# Patient Record
Sex: Female | Born: 1995 | Race: White | Hispanic: No | Marital: Single | Smoking: Never smoker
Health system: Southern US, Community
[De-identification: ages and names within clinical notes are randomized; demographics above are authoritative.]

---
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
## 2017-04-22 ENCOUNTER — Encounter: Payer: Self-pay | Admitting: Emergency Medicine

## 2017-04-22 ENCOUNTER — Emergency Department: Payer: BLUE CROSS/BLUE SHIELD

## 2017-04-22 ENCOUNTER — Emergency Department
Admission: EM | Admit: 2017-04-22 | Discharge: 2017-04-22 | Disposition: A | Discharge status: Home / Self Care | Payer: BLUE CROSS/BLUE SHIELD | Attending: Emergency Medicine | Admitting: Emergency Medicine

## 2017-04-22 DIAGNOSIS — Z87442 Personal history of urinary calculi: Secondary | ICD-10-CM | POA: Insufficient documentation

## 2017-04-22 DIAGNOSIS — R109 Unspecified abdominal pain: Secondary | ICD-10-CM | POA: Diagnosis present

## 2017-04-22 DIAGNOSIS — M549 Dorsalgia, unspecified: Secondary | ICD-10-CM | POA: Diagnosis not present

## 2017-04-22 LAB — URINALYSIS, ROUTINE W REFLEX MICROSCOPIC
BILIRUBIN URINE: NEGATIVE
GLUCOSE, UA: NEGATIVE mg/dL
Hgb urine dipstick: NEGATIVE
Ketones, ur: NEGATIVE mg/dL
Leukocytes, UA: NEGATIVE
Nitrite: NEGATIVE
PH: 6 (ref 5.0–8.0)
Protein, ur: NEGATIVE mg/dL
SPECIFIC GRAVITY, URINE: 1.012 (ref 1.005–1.030)

## 2017-04-22 LAB — POCT PREGNANCY, URINE: Preg Test, Ur: NEGATIVE

## 2017-04-22 LAB — COMPREHENSIVE METABOLIC PANEL
ALT: 14 U/L (ref 14–54)
ANION GAP: 7 (ref 5–15)
AST: 18 U/L (ref 15–41)
Albumin: 4.1 g/dL (ref 3.5–5.0)
Alkaline Phosphatase: 49 U/L (ref 38–126)
BUN: 10 mg/dL (ref 6–20)
CO2: 24 mmol/L (ref 22–32)
Calcium: 9.3 mg/dL (ref 8.9–10.3)
Chloride: 108 mmol/L (ref 101–111)
Creatinine, Ser: 0.71 mg/dL (ref 0.44–1.00)
GFR calc Af Amer: >60 mL/min (ref >60)
Glucose, Bld: 97 mg/dL (ref 65–99)
POTASSIUM: 3.7 mmol/L (ref 3.5–5.1)
Sodium: 139 mmol/L (ref 135–145)
TOTAL PROTEIN: 7.7 g/dL (ref 6.5–8.1)
Total Bilirubin: 0.6 mg/dL (ref 0.3–1.2)

## 2017-04-22 LAB — CBC
HEMATOCRIT: 38.8 % (ref 35.0–47.0)
Hemoglobin: 13.2 g/dL (ref 12.0–16.0)
MCH: 30.4 pg (ref 26.0–34.0)
MCHC: 34.1 g/dL (ref 32.0–36.0)
MCV: 89.3 fL (ref 80.0–100.0)
Platelets: 307 10*3/uL (ref 150–440)
RBC: 4.35 MIL/uL (ref 3.80–5.20)
RDW: 13.1 % (ref 11.5–14.5)
WBC: 5.4 10*3/uL (ref 3.6–11.0)

## 2017-04-22 LAB — LIPASE, BLOOD: LIPASE: 18 U/L (ref 11–51)

## 2017-04-22 MED ORDER — KETOROLAC TROMETHAMINE 30 MG/ML IJ SOLN
15.0000 mg | Freq: Once | INTRAMUSCULAR | Status: AC
  Administered 2017-04-22: 15 mg via INTRAVENOUS
  Filled 2017-04-22: qty 1

## 2017-04-22 MED ORDER — SODIUM CHLORIDE 0.9 % IV BOLUS (SEPSIS)
1000.0000 mL | Freq: Once | INTRAVENOUS | Status: AC
  Administered 2017-04-22: 1000 mL via INTRAVENOUS

## 2017-04-22 NOTE — Discharge Instructions (Signed)
Has been discussed please return to the emergency department for any worsening pain, abdominal pain, fever, or any other symptom personally concerning to yourself.

## 2017-04-22 NOTE — ED Notes (Signed)
Pt c/o all over back pain since this morning with nausea.. Denies vomiting.. States she has a hx of kidney stones and this feels similar. Pt is calm laying on the stretcher texting during the exam.. Denies any blood in urine or vaginal discharge..Marland Kitchen

## 2017-04-22 NOTE — ED Notes (Signed)
Pt back from ultrasound.

## 2017-04-22 NOTE — ED Triage Notes (Signed)
Patient presents to ED via POV from home with c/o bilateral flank pain. Hx of kidney stones that required surgery. Patient denies dysuria or frank blood in urine. Patient also reports nausea. Patient texting during triage.

## 2017-04-22 NOTE — ED Notes (Signed)
Patient transported to Ultrasound 

## 2017-04-22 NOTE — ED Provider Notes (Signed)
[facility] Emergency Department Provider Note  Time seen: 9:45 AM  I have reviewed the triage vital signs and the nursing notes.   HISTORY  Chief Complaint Flank Pain    HPI Tonya Rubio is a 21 y.o. female with a past medical history of a kidney stone presents to the emergency department for back and abdominal discomfort. According to the patient approximately one year ago she had a kidney stone requiring interventional removal. Patient states she was at her doctor this morning for a routine visit. States she has been having some back pain in her mid back on both sides for the past 2 days all at her doctor she developed fairly sharp pain mostly in the right back and right abdomen. Patient states she felt nauseated so they brought her to the emergency department for evaluation. She states she feels like this is very similar to when she had a kidney stone one year ago but denies any dysuria or hematuria. Denies any fever. States her pain is currently a 3/10 dull pain in the bilateral mid back.  History reviewed. No pertinent past medical history.  There are no active problems to display for this patient.   History reviewed. No pertinent surgical history.  Prior to Admission medications   Not on File    No Known Allergies  No family history on file.  Social History Social History  Substance Use Topics  . Smoking status: Never Smoker  . Smokeless tobacco: Never Used  . Alcohol use Yes     Comment: occ    Review of Systems Constitutional: Negative for fever. Cardiovascular: Negative for chest pain. Respiratory: Negative for shortness of breath. Gastrointestinal: Mild right sided abdominal pain. Positive for nausea. Negative for vomiting or diarrhea Genitourinary: Negative for dysuria. Negative for hematuria Musculoskeletal: Positive for bilateral mid back pain Neurological: Negative for headache All other ROS  negative  ____________________________________________   PHYSICAL EXAM:  VITAL SIGNS: ED Triage Vitals  Enc Vitals Group     BP 04/22/17 0927 109/74     Pulse Rate 04/22/17 0927 (!) 58     Resp 04/22/17 0927 20     Temp 04/22/17 0927 97.6 F (36.4 C)     Temp Source 04/22/17 0927 Oral     SpO2 04/22/17 0927 100 %     Weight 04/22/17 0927 135 lb (61.2 kg)     Height 04/22/17 0927 5' 6.25" (1.683 m)     Head Circumference --      Peak Flow --      Pain Score 04/22/17 0929 3     Pain Loc --      Pain Edu? --      Excl. in GC? --     Constitutional: Alert and oriented. Well appearing and in no distress. Eyes: Normal exam ENT   Head: Normocephalic and atraumatic   Mouth/Throat: Mucous membranes are moist. Cardiovascular: Normal rate, regular rhythm.  Respiratory: Normal respiratory effort without tachypnea nor retractions. Breath sounds are clear  Gastrointestinal: Soft, mild right mid abdominal tenderness to palpation without rebound or guarding, no CVA tenderness. Musculoskeletal: Nontender with normal range of motion in all extremities. Neurologic:  Normal speech and language. No gross focal neurologic deficits  Skin:  Skin is warm, dry and intact.  Psychiatric: Mood and affect are normal.   ____________________________________________   RADIOLOGY  Renal ultrasound normal Right upper quadrant ultrasound normal  ____________________________________________   INITIAL IMPRESSION / ASSESSMENT AND PLAN / ED COURSE  Pertinent  labs & imaging results that were available during my care of the patient were reviewed by me and considered in my medical decision making (see chart for details).  Patient presents emergency department for bilateral back pain and right abdominal pain. Patient states the symptoms seem similar to her past kidney stone. We will check labs, obtain an ultrasound of the kidneys as well as right upper quadrant. We will check labs and closely  monitor.  Patient's ultrasounds are normal. Labs are normal. Patient states she is feeling much better. At this time I believe the patient is safe to be discharged home and will follow up with her doctor. I discussed return precautions.  ____________________________________________   FINAL CLINICAL IMPRESSION(S) / ED DIAGNOSES  Back pain Abdominal pain    Minna Antis, MD 04/22/17 1146

## 2017-04-23 LAB — URINE CULTURE: CULTURE: NO GROWTH

## 2017-04-24 IMAGING — CR DG ANKLE COMPLETE 3+V*L*
1 series · 3 of 3 positions shown · non-contrast
Comparison: None.

CLINICAL DATA: Patient reports injuring her L ankle at Lacrosse
practice on [REDACTED]. Was seen at school clinic and given crutches and
ace wrap and was told to stay off of ankle for three days. Patient
reports she still cannot put pressure on left foot without extreme
pain.

EXAM:
LEFT ANKLE COMPLETE - 3+ VIEW

[Series 1: ap · 0.17mm/px · 3 of 3 slices shown]
[im 1/3]
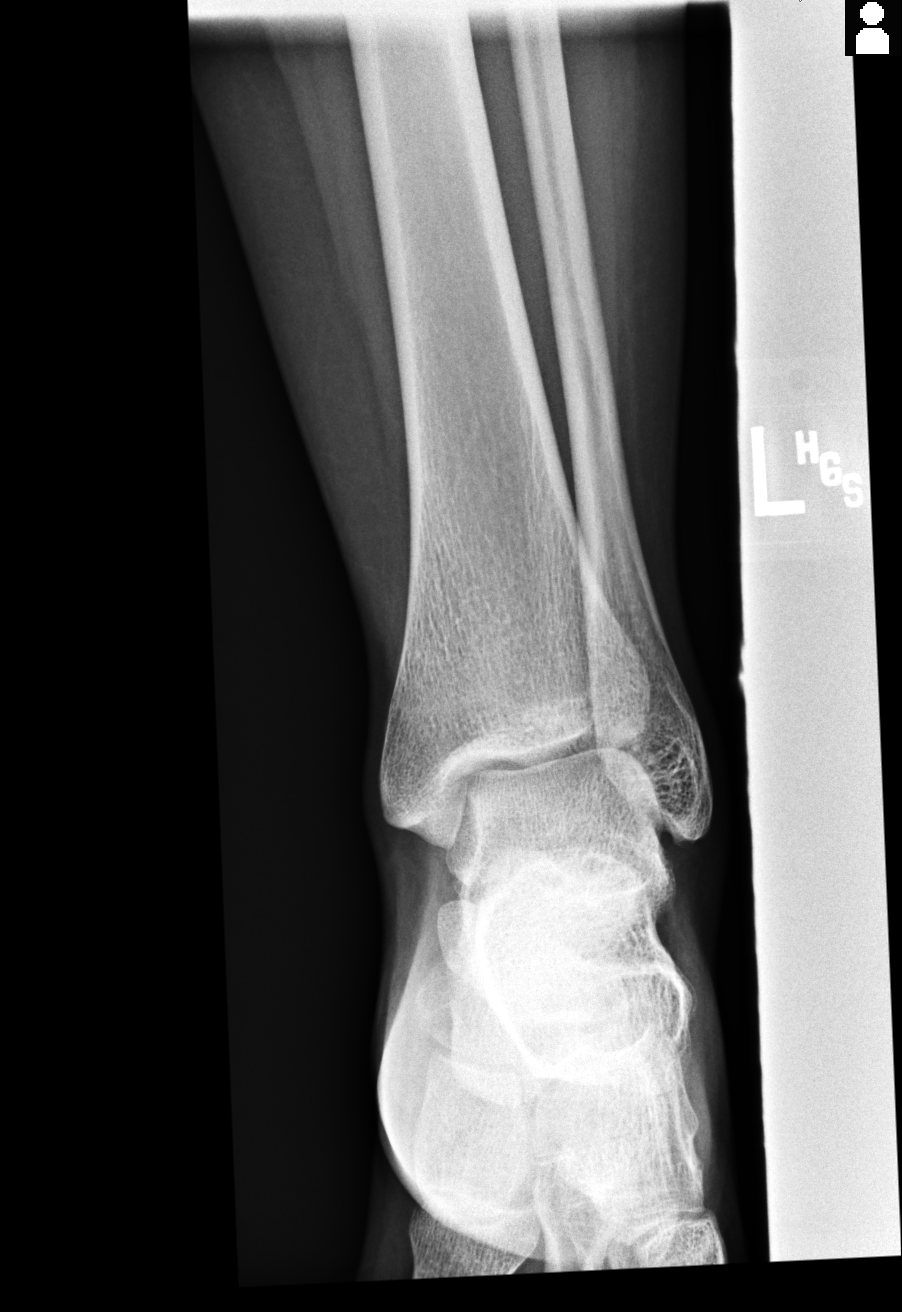
[im 2/3]
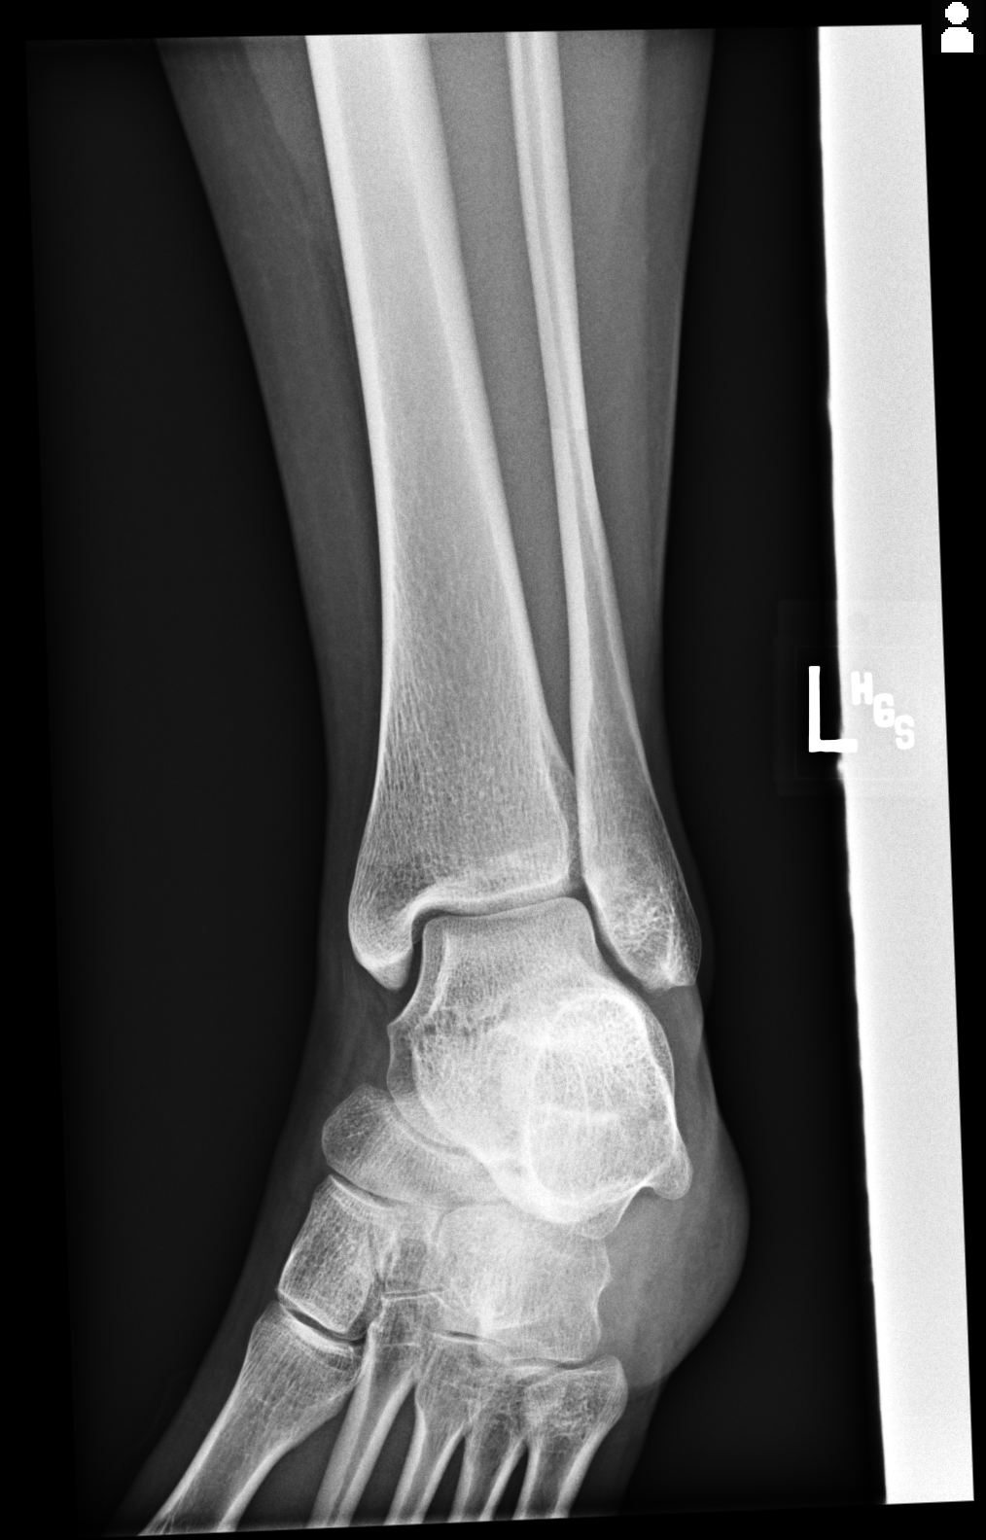
[im 3/3]
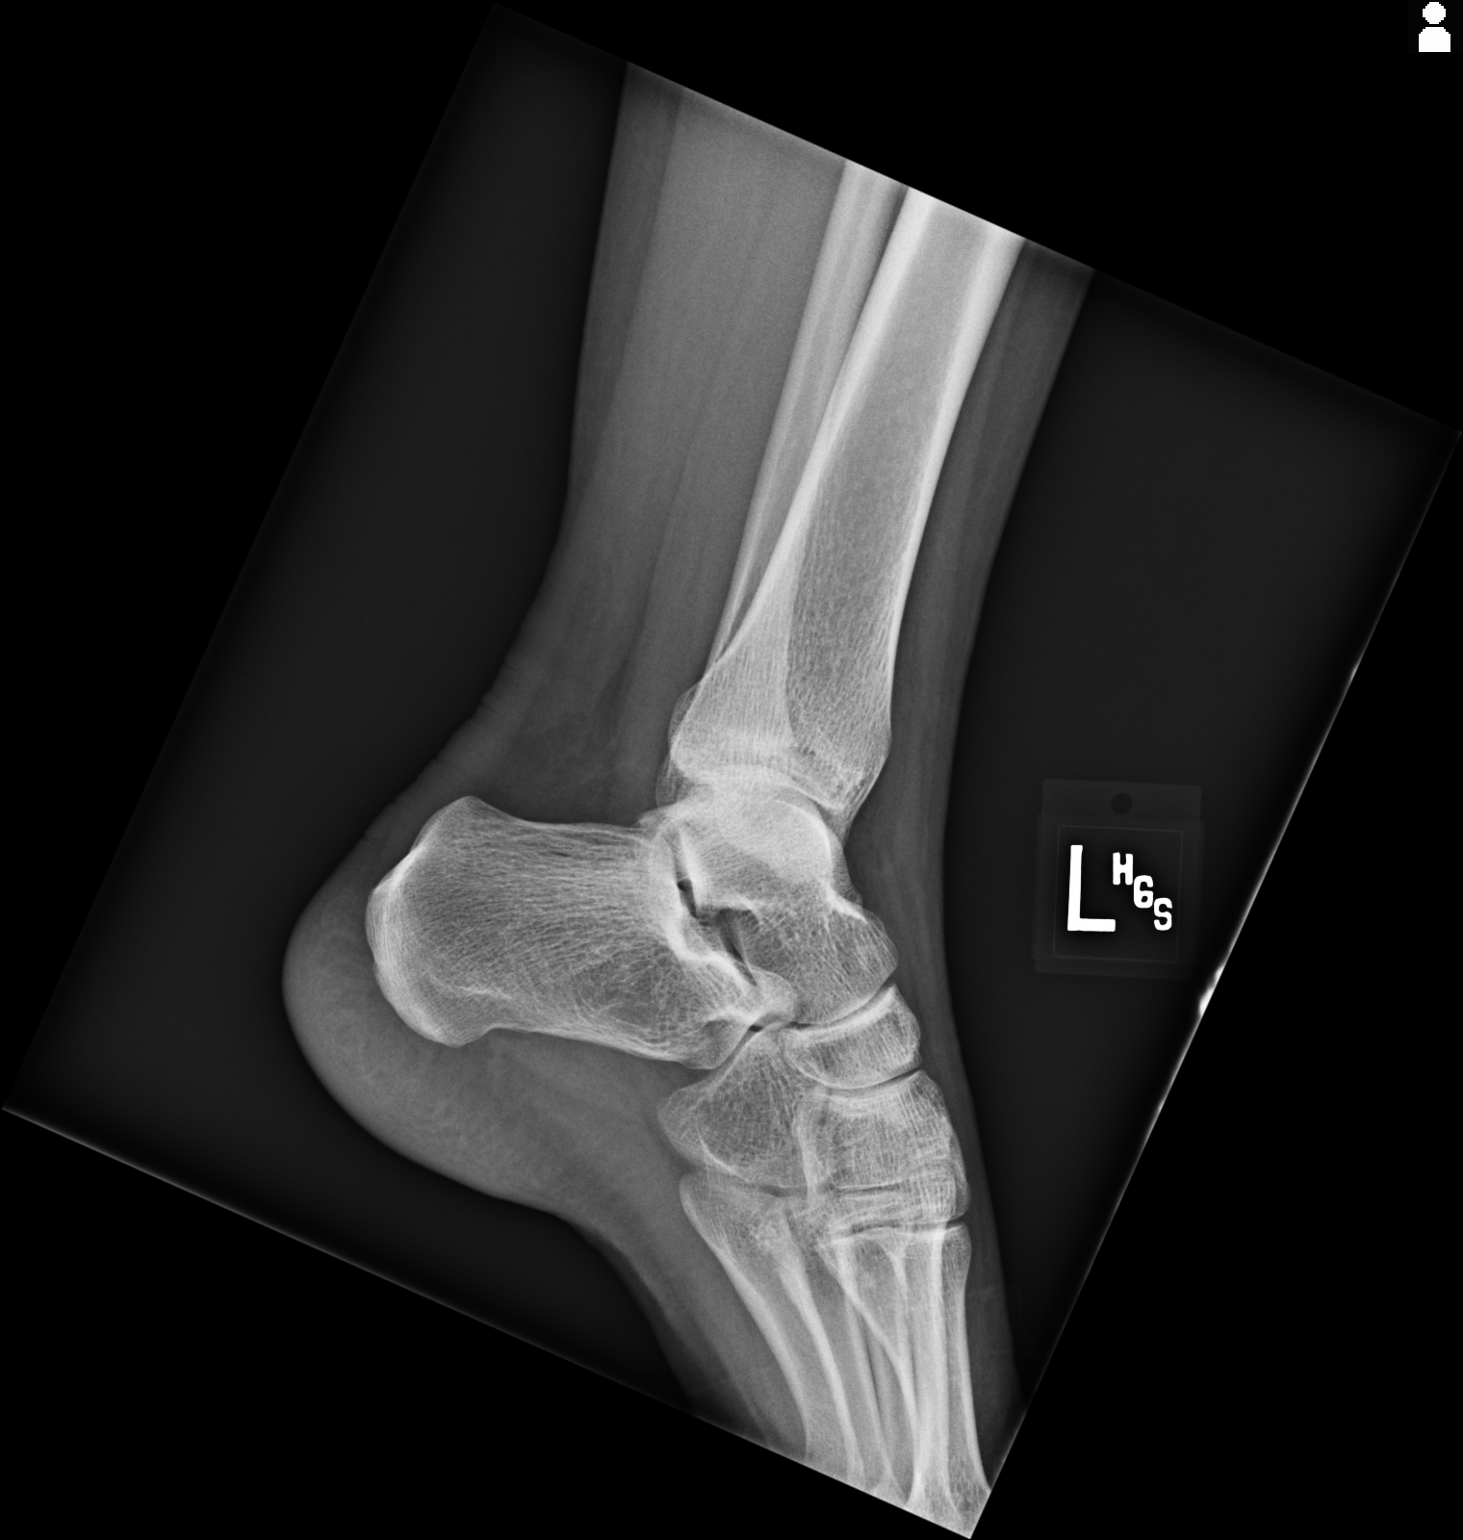

[3 of 3 positions shown; findings below may reference images not displayed]

FINDINGS: There is no evidence of fracture, dislocation, or joint effusion.
There is no evidence of arthropathy or other focal bone abnormality.
Soft tissues are unremarkable.
IMPRESSION: Negative.

## 2018-03-20 IMAGING — US US RENAL
1 series · 14 of 25 positions shown · non-contrast
Comparison: No recent prior .

CLINICAL DATA: Right upper quadrant and back pain.

EXAM:
RENAL / URINARY TRACT ULTRASOUND COMPLETE

[Series 1: us renal · 0.19mm/px · 14 of 34 slices shown]
[im 1/34]
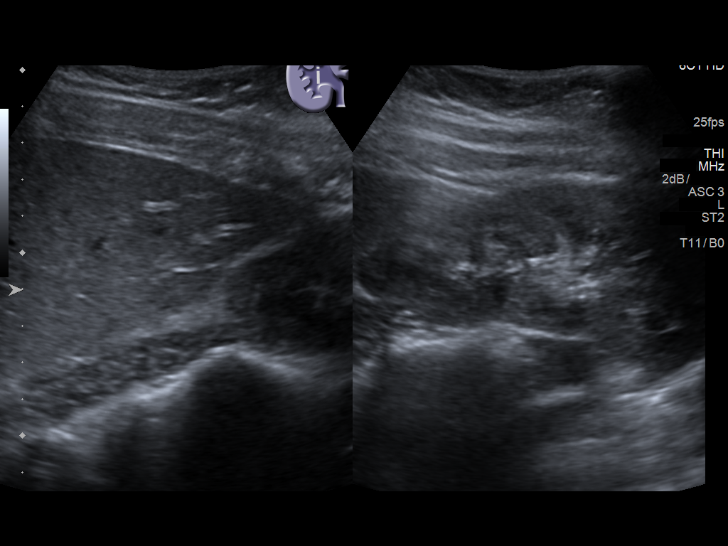
[im 3/34]
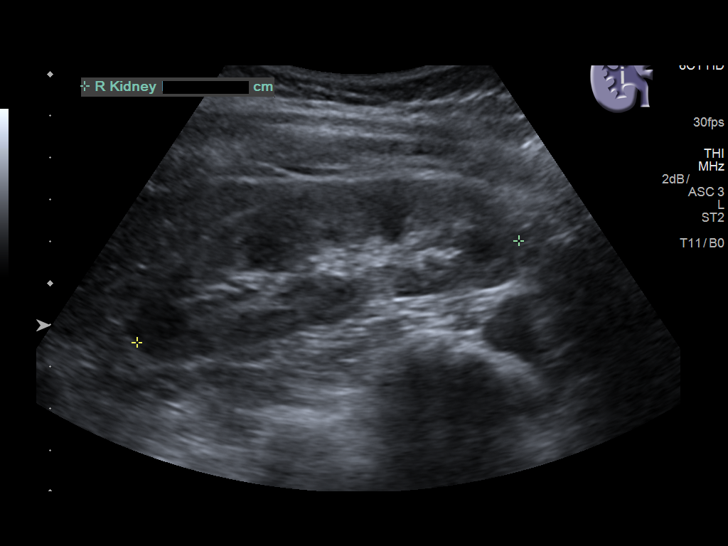
[im 6/34]
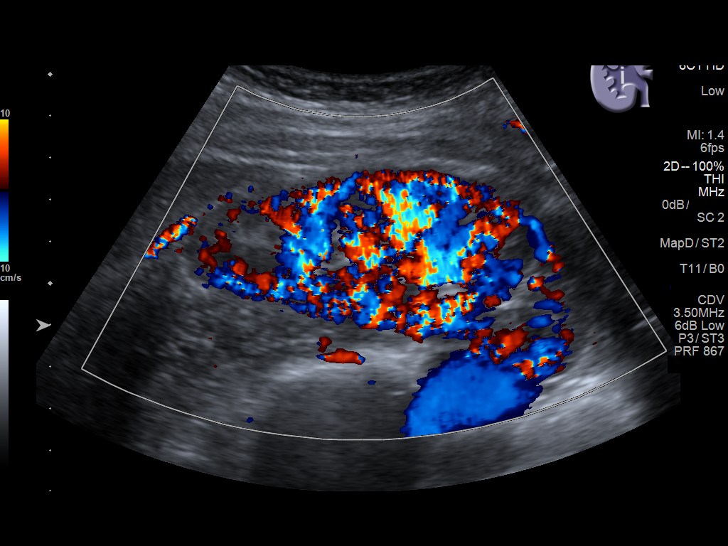
[im 9/34]
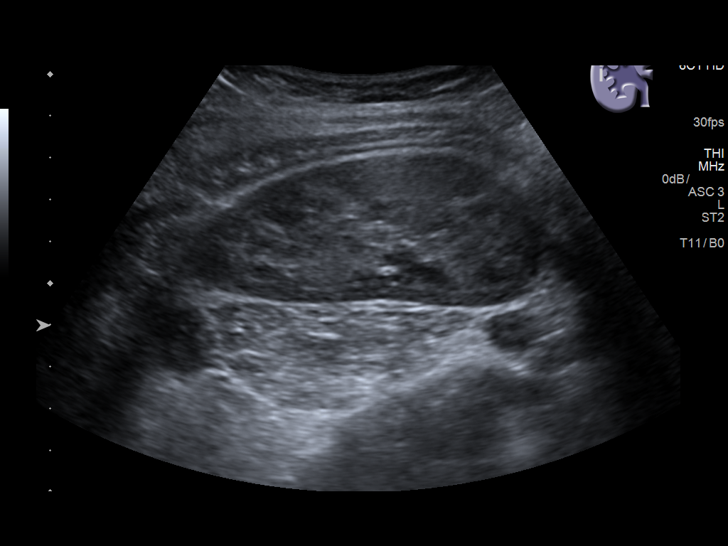
[im 12/34]
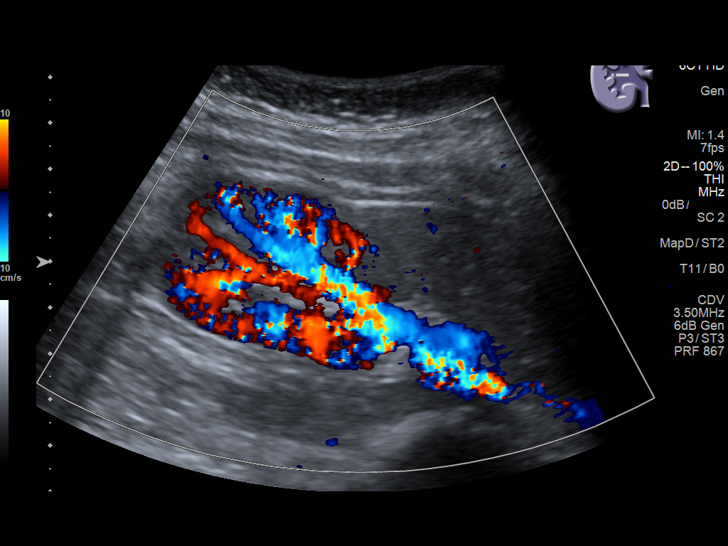
[im 13/34]
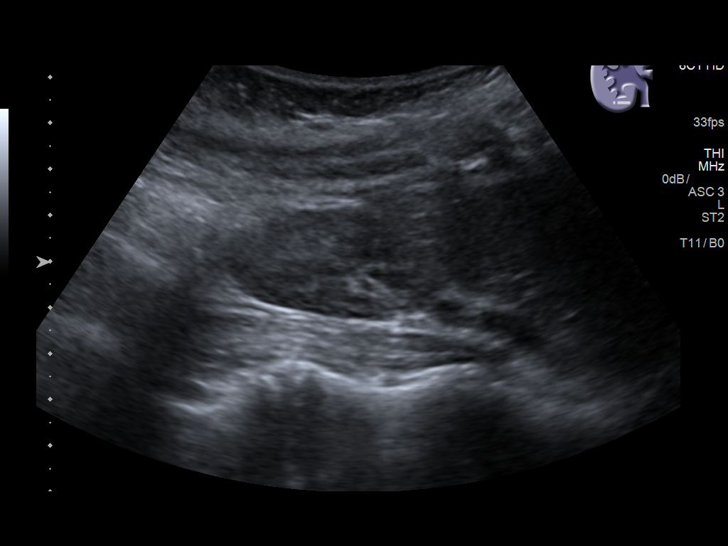
[im 16/34]
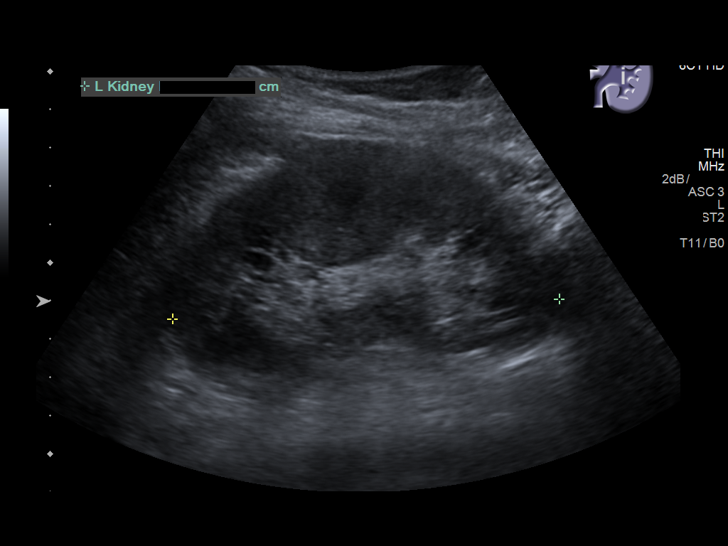
[im 18/34]
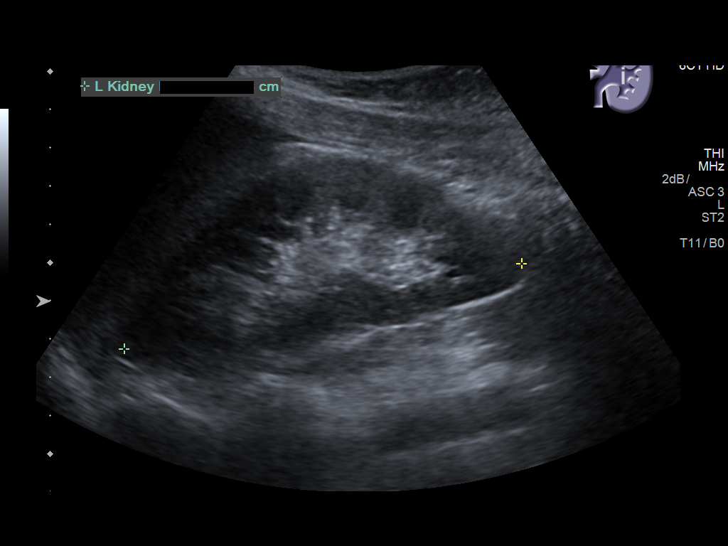
[im 21/34]
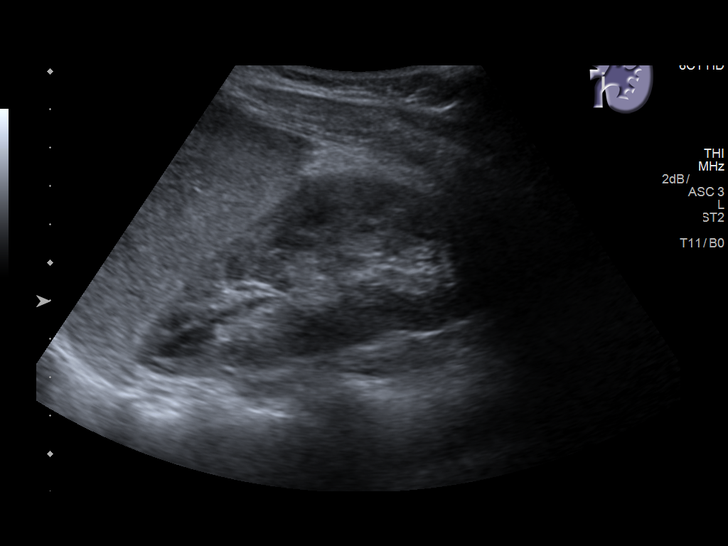
[im 23/34]
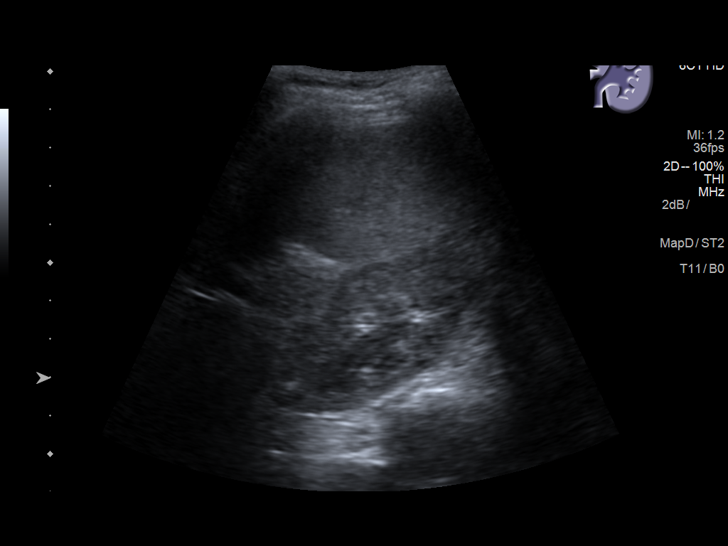
[im 25/34]
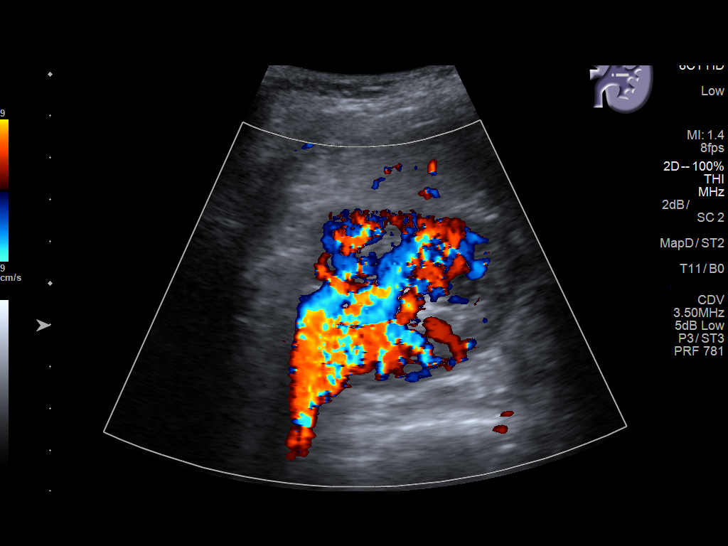
[im 28/34]
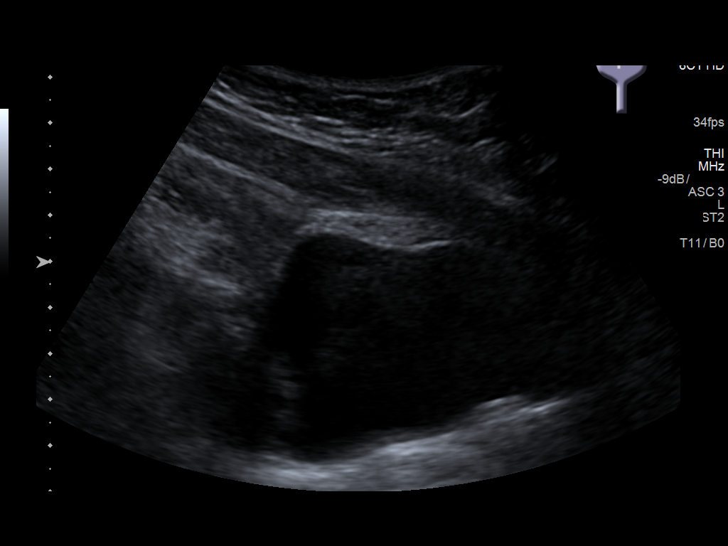
[im 31/34]
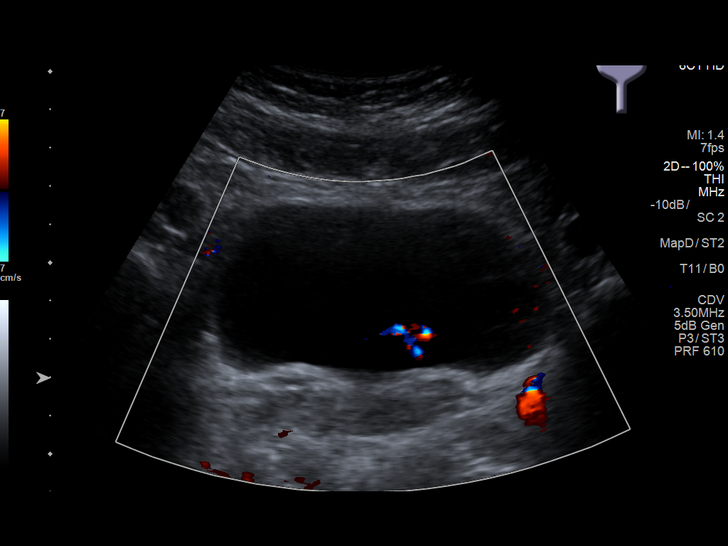
[im 34/34]
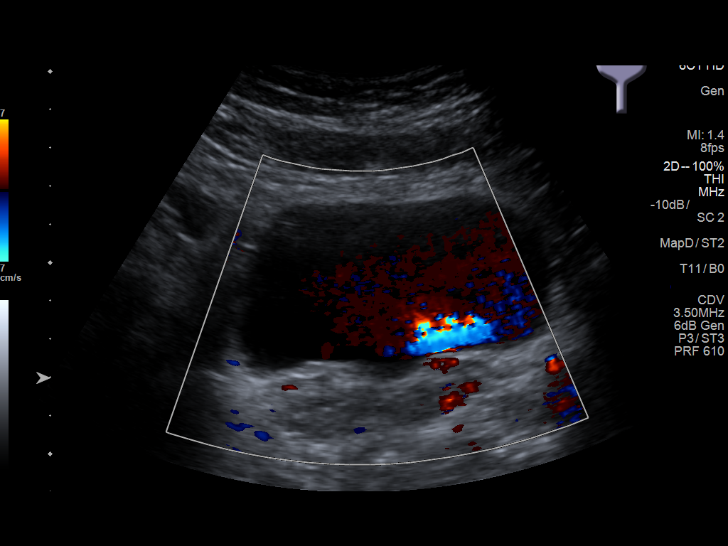

[14 of 25 positions shown; findings below may reference images not displayed]

FINDINGS: Right Kidney:

Length: 10.4 cm. Echogenicity within normal limits. No mass or
hydronephrosis visualized.

Left Kidney:

Length: 10.6 cm. Echogenicity within normal limits. No mass or
hydronephrosis visualized.

Bladder:

Appears normal for degree of bladder distention.
IMPRESSION: No acute or focal abnormality identified.

## 2018-03-20 IMAGING — US US ABDOMEN LIMITED
1 series · 14 of 25 positions shown · non-contrast
Comparison: No recent prior .

CLINICAL DATA: Right upper quadrant pain.  Back pain.

EXAM:
ULTRASOUND ABDOMEN LIMITED RIGHT UPPER QUADRANT

[Series 1: us abdomen limited · 0.19mm/px · 14 of 37 slices shown]
[im 1/37]
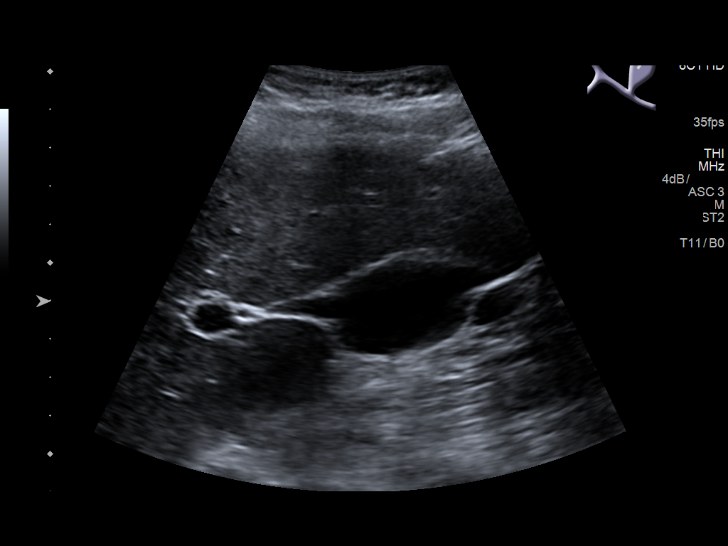
[im 4/37]
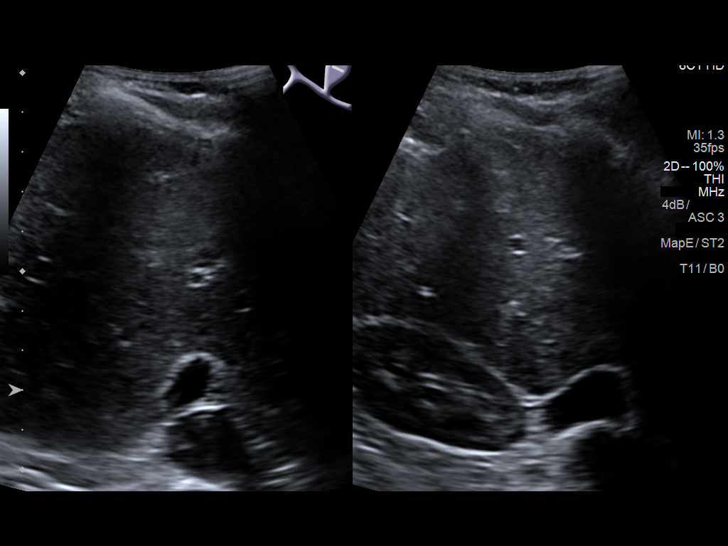
[im 7/37]
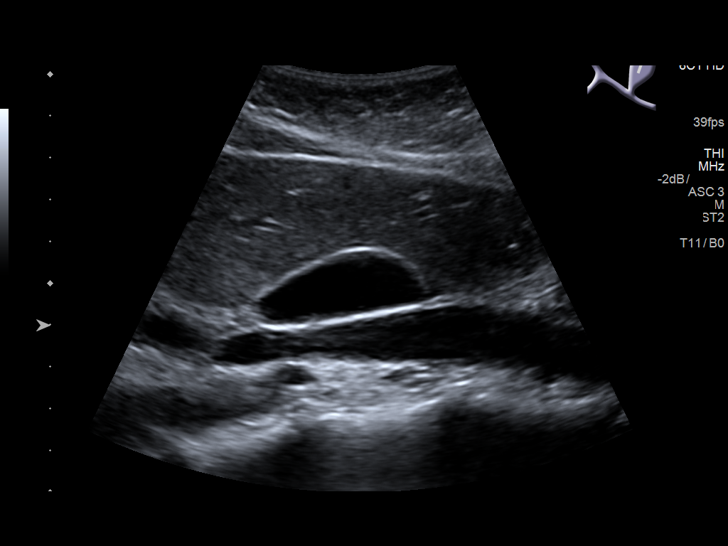
[im 10/37]
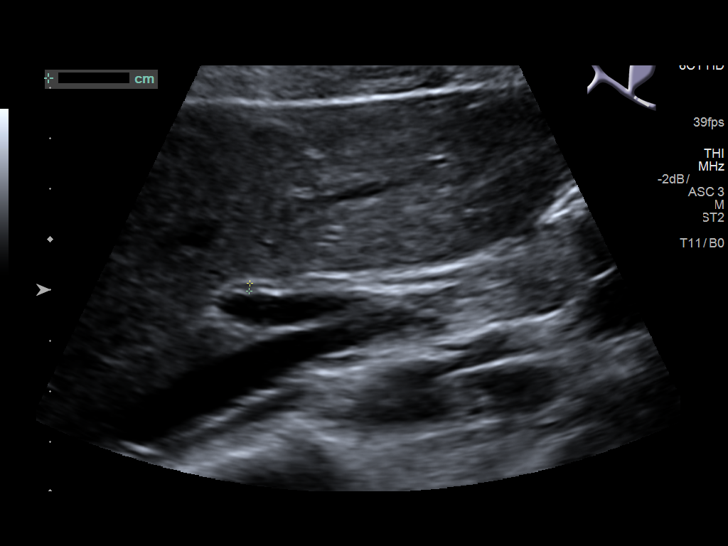
[im 13/37]
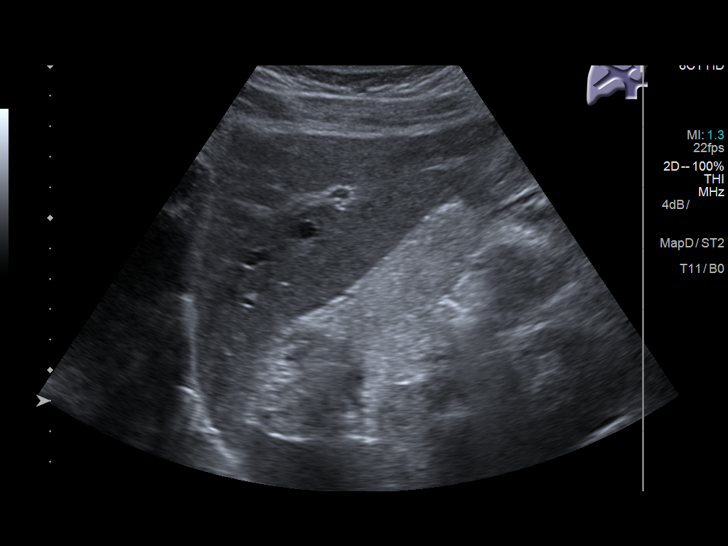
[im 14/37]
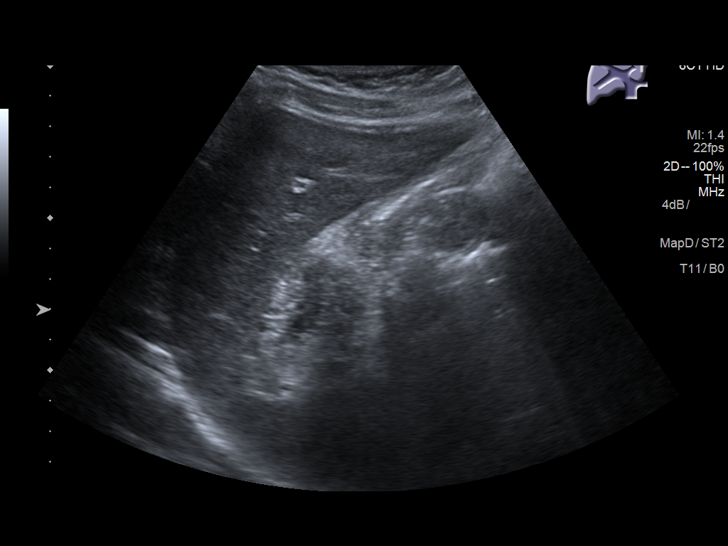
[im 17/37]
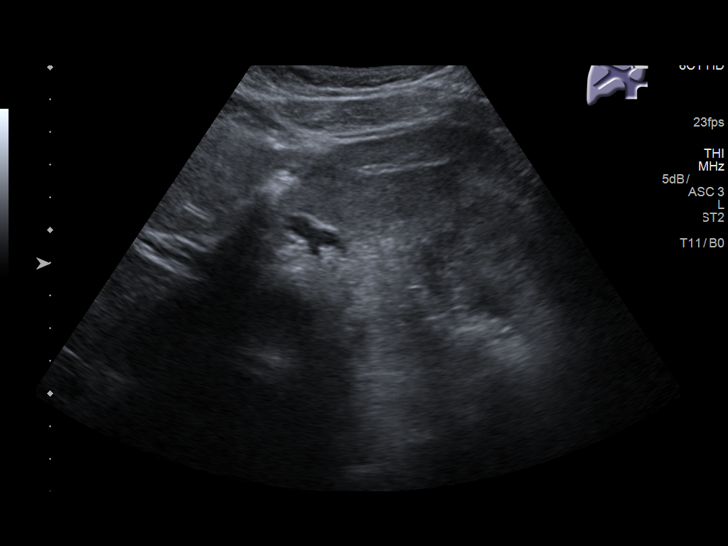
[im 20/37]
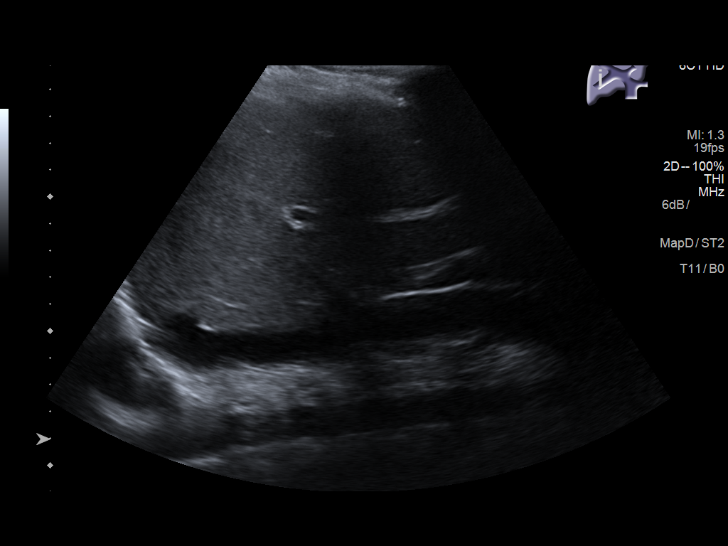
[im 23/37]
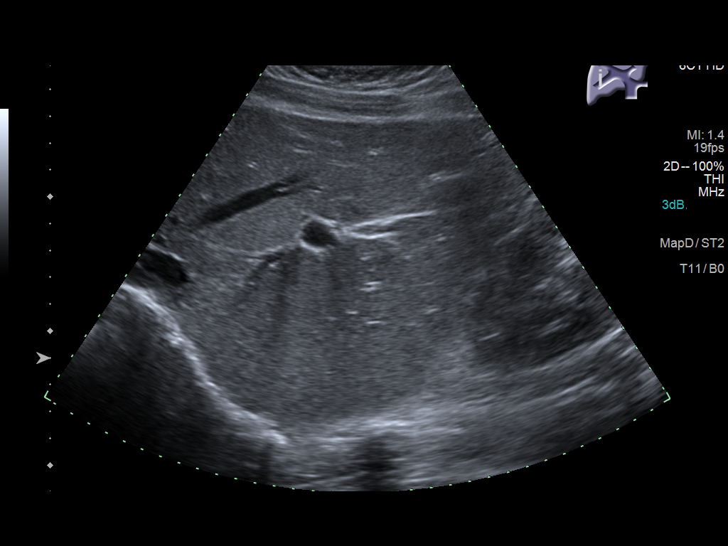
[im 25/37]
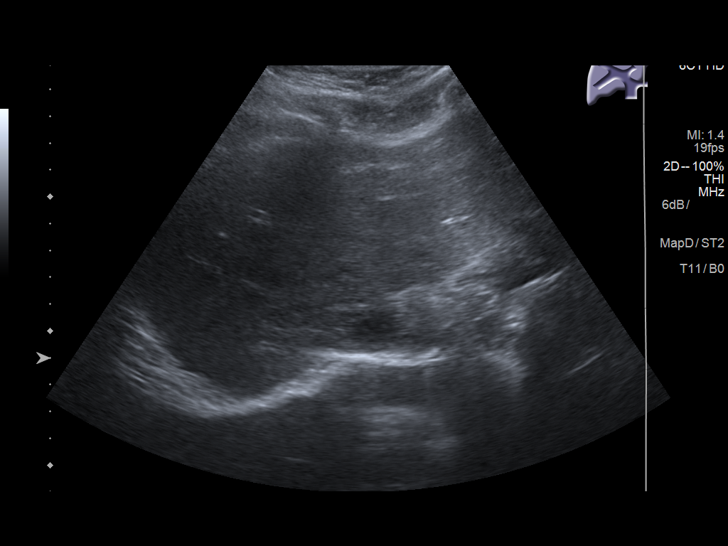
[im 28/37]
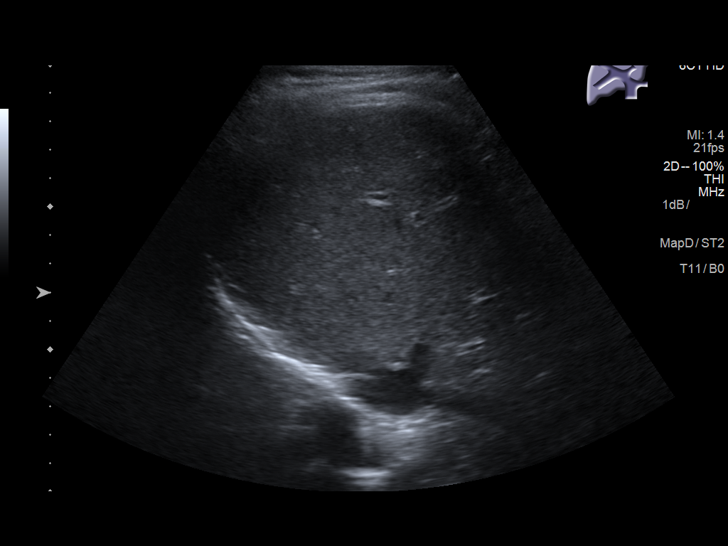
[im 31/37]
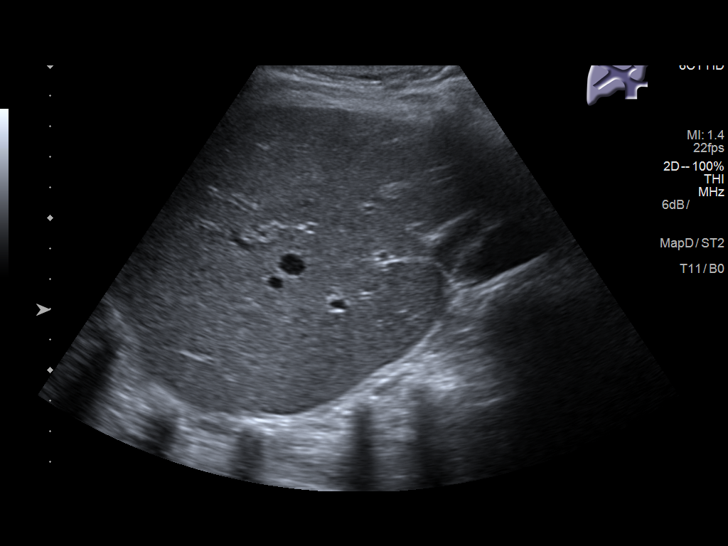
[im 34/37]
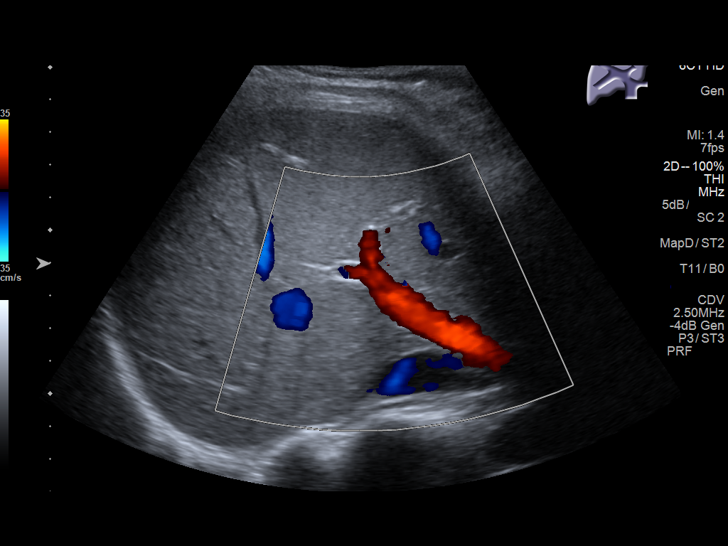
[im 37/37]
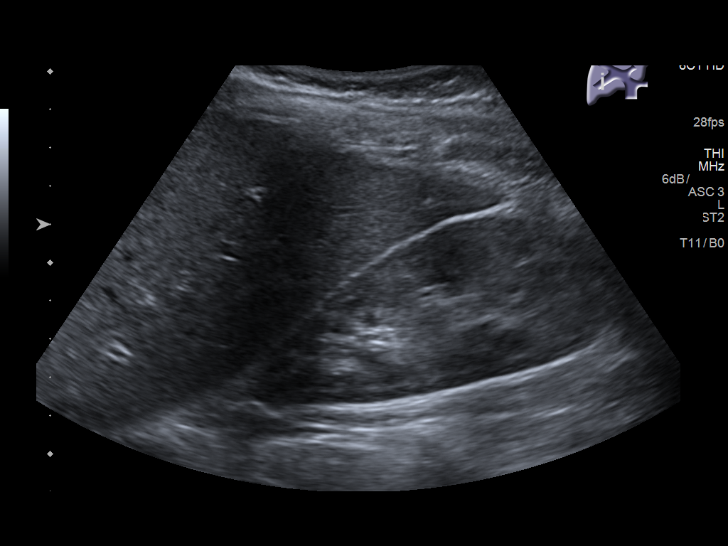

[14 of 25 positions shown; findings below may reference images not displayed]

FINDINGS: Gallbladder:

No gallstones or wall thickening visualized. No sonographic Murphy
sign noted by sonographer.

Common bile duct:

Diameter: 1.7 mm

Liver:

No focal lesion identified. Within normal limits in parenchymal
echogenicity. Portal vein is patent on color Doppler imaging with
normal direction of blood flow towards the liver.
IMPRESSION: Normal exam.
